# Patient Record
Sex: Female | Born: 1965 | Race: White | Hispanic: No | Marital: Married | Smoking: Never smoker
Health system: Southern US, Community
[De-identification: ages and names within clinical notes are randomized; demographics above are authoritative.]

---

## 2018-06-03 ENCOUNTER — Emergency Department: Payer: PRIVATE HEALTH INSURANCE

## 2018-06-03 ENCOUNTER — Other Ambulatory Visit: Payer: Self-pay

## 2018-06-03 ENCOUNTER — Encounter: Payer: Self-pay | Admitting: Emergency Medicine

## 2018-06-03 ENCOUNTER — Emergency Department
Admission: EM | Admit: 2018-06-03 | Discharge: 2018-06-03 | Disposition: A | Discharge status: Home / Self Care | Payer: PRIVATE HEALTH INSURANCE | Attending: Emergency Medicine | Admitting: Emergency Medicine

## 2018-06-03 DIAGNOSIS — F10929 Alcohol use, unspecified with intoxication, unspecified: Secondary | ICD-10-CM | POA: Insufficient documentation

## 2018-06-03 DIAGNOSIS — S52502A Unspecified fracture of the lower end of left radius, initial encounter for closed fracture: Secondary | ICD-10-CM | POA: Diagnosis not present

## 2018-06-03 DIAGNOSIS — R51 Headache: Secondary | ICD-10-CM | POA: Insufficient documentation

## 2018-06-03 DIAGNOSIS — S0181XA Laceration without foreign body of other part of head, initial encounter: Secondary | ICD-10-CM | POA: Diagnosis present

## 2018-06-03 DIAGNOSIS — W108XXA Fall (on) (from) other stairs and steps, initial encounter: Secondary | ICD-10-CM | POA: Insufficient documentation

## 2018-06-03 DIAGNOSIS — Y939 Activity, unspecified: Secondary | ICD-10-CM | POA: Insufficient documentation

## 2018-06-03 DIAGNOSIS — Y929 Unspecified place or not applicable: Secondary | ICD-10-CM | POA: Insufficient documentation

## 2018-06-03 DIAGNOSIS — Y999 Unspecified external cause status: Secondary | ICD-10-CM | POA: Diagnosis not present

## 2018-06-03 DIAGNOSIS — W19XXXA Unspecified fall, initial encounter: Secondary | ICD-10-CM

## 2018-06-03 LAB — CBC
HEMATOCRIT: 36.2 % (ref 35.0–47.0)
HEMOGLOBIN: 12.8 g/dL (ref 12.0–16.0)
MCH: 31.6 pg (ref 26.0–34.0)
MCHC: 35.4 g/dL (ref 32.0–36.0)
MCV: 89.1 fL (ref 80.0–100.0)
Platelets: 234 10*3/uL (ref 150–440)
RBC: 4.07 MIL/uL (ref 3.80–5.20)
RDW: 14.2 % (ref 11.5–14.5)
WBC: 8.8 10*3/uL (ref 3.6–11.0)

## 2018-06-03 LAB — BASIC METABOLIC PANEL
ANION GAP: 8 (ref 5–15)
BUN: 15 mg/dL (ref 6–20)
CHLORIDE: 109 mmol/L (ref 98–111)
CO2: 23 mmol/L (ref 22–32)
CREATININE: 0.63 mg/dL (ref 0.44–1.00)
Calcium: 8.3 mg/dL — ABNORMAL LOW (ref 8.9–10.3)
GFR calc non Af Amer: >60 mL/min (ref >60)
Glucose, Bld: 107 mg/dL — ABNORMAL HIGH (ref 70–99)
POTASSIUM: 3.5 mmol/L (ref 3.5–5.1)
Sodium: 140 mmol/L (ref 135–145)

## 2018-06-03 LAB — TROPONIN I

## 2018-06-03 LAB — ETHANOL: Alcohol, Ethyl (B): 272 mg/dL — ABNORMAL HIGH (ref <10)

## 2018-06-03 MED ORDER — IBUPROFEN 600 MG PO TABS
600.0000 mg | ORAL_TABLET | Freq: Once | ORAL | Status: AC
  Administered 2018-06-03: 600 mg via ORAL
  Filled 2018-06-03: qty 1

## 2018-06-03 MED ORDER — HYDROCODONE-ACETAMINOPHEN 5-325 MG PO TABS
1.0000 | ORAL_TABLET | Freq: Once | ORAL | Status: AC
  Administered 2018-06-03: 1 via ORAL
  Filled 2018-06-03: qty 1

## 2018-06-03 MED ORDER — DOCUSATE SODIUM 100 MG PO CAPS: ORAL_CAPSULE | ORAL | 0 refills | Status: AC

## 2018-06-03 MED ORDER — ACETAMINOPHEN 325 MG PO TABS
650.0000 mg | ORAL_TABLET | Freq: Once | ORAL | Status: AC
  Administered 2018-06-03: 650 mg via ORAL
  Filled 2018-06-03: qty 2

## 2018-06-03 MED ORDER — HYDROCODONE-ACETAMINOPHEN 5-325 MG PO TABS: 1.0000 | ORAL_TABLET | ORAL | 0 refills | Status: AC | PRN

## 2018-06-03 NOTE — Discharge Instructions (Signed)
You have been seen in the Emergency Department (ED) today for a fall.  Your work up does not show any injuries that will keep you in the hospital.  You do have a severe fracture of your left wrist and will likely need surgery.  Please follow up with an orthopedic surgeon near your home at the next available opportunity.  Please avoid drinking any additional alcohol in preparation for probable surgery.  Please take over-the-counter ibuprofen and/or Tylenol as needed for your pain (unless you have an allergy or your doctor as told you not to take them), or take any prescribed medication as instructed.  Take Norco as prescribed for severe pain. Do not drink alcohol, drive or participate in any other potentially dangerous activities while taking this medication as it may make you sleepy. Do not take this medication with any other sedating medications, either prescription or over-the-counter. If you were prescribed Percocet or Vicodin, do not take these with acetaminophen (Tylenol) as it is already contained within these medications.   This medication is an opiate (or narcotic) pain medication and can be habit forming.  Use it as little as possible to achieve adequate pain control.  Do not use or use it with extreme caution if you have a history of opiate abuse or dependence.  If you are on a pain contract with your primary care doctor or a pain specialist, be sure to let them know you were prescribed this medication today from the [facility].  This medication is intended for your use only - do not give any to anyone else and keep it in a secure place where nobody else, especially children, have access to it.  It will also cause or worsen constipation, so you may want to consider taking an over-the-counter stool softener while you are taking this medication.  Please follow up with your doctor regarding today's Emergency Department (ED) visit and your recent fall.    Return to the ED if  you have any headache, confusion, slurred speech, weakness/numbness of any arm or leg, or any increased pain.

## 2018-06-03 NOTE — ED Provider Notes (Signed)
[facility] Emergency Department Provider Note  ____________________________________________   First MD Initiated Contact with Patient 06/03/18 0121     (approximate)  I have reviewed the triage vital signs and the nursing notes.   HISTORY  Chief Complaint Fall    HPI Mary Willis is a 52 y.o. female who denies any chronic medical issues but who does admit to heavy drinking on the weekend.  She presents by EMS for evaluation after a fall.  Reportedly the patient fell down about 9 stairs after drinking a large amount of alcohol.  She is currently awake and alert.  She denies trying to hurt herself and reports that she only drinks on the weekend but admits that she may have a drinking problem.  She does not believe that she lost consciousness.  She also has pain and deformity of the left wrist which she is not able to move without severe pain.  She has no pain in her neck and only a mild headache.  She denies any chest pain, shortness of breath, nausea, vomiting, abdominal pain, and pain in her legs.  Everything occurred very acutely and rapidly and the pain in the left wrist is severe with movement, mild at rest, rest of the pain is mild.  History reviewed. No pertinent past medical history.  There are no active problems to display for this patient.   History reviewed. No pertinent surgical history.  Prior to Admission medications   Medication Sig Start Date End Date Taking? Authorizing Provider  docusate sodium (COLACE) 100 MG capsule Take 1 tablet once or twice daily as needed for constipation while taking narcotic pain medicine 06/03/18   Loleta Rose, MD  HYDROcodone-acetaminophen (NORCO/VICODIN) 5-325 MG tablet Take 1-2 tablets by mouth every 4 (four) hours as needed for moderate pain. 06/03/18   Loleta Rose, MD    Allergies Patient has no known allergies.  History reviewed. No pertinent family history.  Social History Social History   Tobacco  Use  . Smoking status: Never Smoker  . Smokeless tobacco: Never Used  Substance Use Topics  . Alcohol use: Yes  . Drug use: Never    Review of Systems Constitutional: No fever/chills Eyes: No visual changes. ENT: No sore throat. Cardiovascular: Denies chest pain. Respiratory: Denies shortness of breath. Gastrointestinal: No abdominal pain.  No nausea, no vomiting.  No diarrhea.  No constipation. Genitourinary: Negative for dysuria. Musculoskeletal: Pain and deformity in left wrist after fall.  Negative for neck pain.  Negative for back pain. Integumentary: Negative for rash. Neurological: Negative for headaches, focal weakness or numbness. Psychiatric:Intoxication with alcohol, no SI or HI  ____________________________________________   PHYSICAL EXAM:  VITAL SIGNS: ED Triage Vitals  Enc Vitals Group     BP 06/03/18 0100 107/70     Pulse Rate 06/03/18 0100 82     Resp 06/03/18 0100 (!) 27     Temp 06/03/18 0103 98 F (36.7 C)     Temp Source 06/03/18 0103 Oral     SpO2 06/03/18 0100 95 %     Weight 06/03/18 0104 81.6 kg (180 lb)     Height 06/03/18 0104 1.803 m (5\' 11" )     Head Circumference --      Peak Flow --      Pain Score 06/03/18 0104 0     Pain Loc --      Pain Edu? --      Excl. in GC? --     Constitutional: Alert and  oriented.  Generally well-appearing in spite of injuries, no acute distress, acting appropriate. Eyes: Conjunctivae are normal. PERRL. EOMI. Head: Atraumatic except for laceration along the left jawline as described below Nose: No congestion/rhinnorhea. Mouth/Throat: Mucous membranes are moist. Neck: No stridor.  No meningeal signs.  No cervical spine tenderness to palpation. Cardiovascular: Normal rate, regular rhythm. Good peripheral circulation. Grossly normal heart sounds. Respiratory: Normal respiratory effort.  No retractions. Lungs CTAB. Gastrointestinal: Soft and nontender. No distention.  Musculoskeletal: Gross deformity of left  wrist consistent with distal radial fracture.  Neurovascularly intact with normal sensation of the hand. Able to move fingers. Neurologic:  Normal speech and language. No gross focal neurologic deficits are appreciated.  Skin:  Skin is warm, dry and intact. No rash noted. Small sub-cm laceration along left mandible, bleeding well controlled. Psychiatric: Mood and affect are normal. Speech and behavior are normal.  Good insight and judgment. ____________________________________________   LABS (all labs ordered are listed, but only abnormal results are displayed)  Labs Reviewed  BASIC METABOLIC PANEL - Abnormal; Notable for the following components:      Result Value   Glucose, Bld 107 (*)    Calcium 8.3 (*)    All other components within normal limits  ETHANOL - Abnormal; Notable for the following components:   Alcohol, Ethyl (B) 272 (*)    All other components within normal limits  TROPONIN I  CBC   ____________________________________________  EKG  ED ECG REPORT I, Loleta Rose, the attending physician, personally viewed and interpreted this ECG.  Date: 06/03/2018 EKG Time: 01:04 Rate: 80 Rhythm: normal sinus rhythm QRS Axis: normal Intervals: normal ST/T Wave abnormalities: normal Narrative Interpretation: no evidence of acute ischemia  ____________________________________________  RADIOLOGY I, Loleta Rose, personally viewed and evaluated these images (plain radiographs) as part of my medical decision making, as well as reviewing the written report by the radiologist.  ED MD interpretation: Comminuted and impacted left distal radial fracture.  No acute findings on CT scans.  Official radiology report(s): Dg Wrist Complete Left  Result Date: 06/03/2018 CLINICAL DATA:  Left wrist pain and deformity after fall down stairs. EXAM: LEFT WRIST - COMPLETE 3+ VIEW COMPARISON:  None. FINDINGS: Comminuted impacted fracture of the distal radial metaphysis. Mild apex volar  angulation. Fracture involves the distal radioulnar and possible radiocarpal joint. Associated displacement styloid fracture. Carpal bones appear intact. Soft tissue edema at the fracture sites. IMPRESSION: Comminuted impacted distal radius fracture extending into the distal radioulnar and possible radiocarpal joints. Displaced ulna styloid fracture. Electronically Signed   By: Narda Rutherford M.D.   On: 06/03/2018 01:45   Ct Head Wo Contrast  Result Date: 06/03/2018 CLINICAL DATA:  Unwitnessed fall down 9 stairs. Alcohol intoxication. EXAM: CT HEAD WITHOUT CONTRAST CT CERVICAL SPINE WITHOUT CONTRAST TECHNIQUE: Multidetector CT imaging of the head and cervical spine was performed following the standard protocol without intravenous contrast. Multiplanar CT image reconstructions of the cervical spine were also generated. COMPARISON:  None. FINDINGS: CT HEAD FINDINGS BRAIN: No intraparenchymal hemorrhage, mass effect nor midline shift. The ventricles and sulci are normal. No acute large vascular territory infarcts. No abnormal extra-axial fluid collections. Basal cisterns are patent. Cerebellar tonsils at but not below the foramen magnum. 7 mm calcification arising from inner table calvarium, potential meningioma without mass effect. VASCULAR: Unremarkable. SKULL/SOFT TISSUES: No skull fracture. No significant soft tissue swelling. ORBITS/SINUSES: The included ocular globes and orbital contents are normal.Trace paranasal sinus mucosal thickening. Mastoid air cells are well aerated. OTHER: None.  CT CERVICAL SPINE FINDINGS ALIGNMENT: Straightened lordosis.  Vertebral bodies in alignment. SKULL BASE AND VERTEBRAE: Cervical vertebral bodies and posterior elements are intact. Developmentally unfused C3 spinous process. Intervertebral disc heights preserved. No destructive bony lesions. 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 SOFT TISSUES AND SPINAL CANAL: Nonacute. DISC LEVELS: No significant osseous  canal stenosis. Mild C3-4 neural foraminal narrowing. UPPER CHEST: Lung apices are clear. OTHER: None. IMPRESSION: CT HEAD: 1. No acute intracranial process. 2. Subcentimeter RIGHT frontal suspected meningioma without mass effect; otherwise negative noncontrast CT HEAD. CT CERVICAL SPINE: 1. No fracture or malalignment. Electronically Signed   By: Awilda Metroourtnay  Bloomer M.D.   On: 06/03/2018 01:57   Ct Cervical Spine Wo Contrast  Result Date: 06/03/2018 CLINICAL DATA:  Unwitnessed fall down 9 stairs. Alcohol intoxication. EXAM: CT HEAD WITHOUT CONTRAST CT CERVICAL SPINE WITHOUT CONTRAST TECHNIQUE: Multidetector CT imaging of the head and cervical spine was performed following the standard protocol without intravenous contrast. Multiplanar CT image reconstructions of the cervical spine were also generated. COMPARISON:  None. FINDINGS: CT HEAD FINDINGS BRAIN: No intraparenchymal hemorrhage, mass effect nor midline shift. The ventricles and sulci are normal. No acute large vascular territory infarcts. No abnormal extra-axial fluid collections. Basal cisterns are patent. Cerebellar tonsils at but not below the foramen magnum. 7 mm calcification arising from inner table calvarium, potential meningioma without mass effect. VASCULAR: Unremarkable. SKULL/SOFT TISSUES: No skull fracture. No significant soft tissue swelling. ORBITS/SINUSES: The included ocular globes and orbital contents are normal.Trace paranasal sinus mucosal thickening. Mastoid air cells are well aerated. OTHER: None. CT CERVICAL SPINE FINDINGS ALIGNMENT: Straightened lordosis.  Vertebral bodies in alignment. SKULL BASE AND VERTEBRAE: Cervical vertebral bodies and posterior elements are intact. Developmentally unfused C3 spinous process. Intervertebral disc heights preserved. No destructive bony lesions. 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 SOFT TISSUES AND SPINAL CANAL: Nonacute. DISC LEVELS: No significant osseous canal stenosis. Mild  C3-4 neural foraminal narrowing. UPPER CHEST: Lung apices are clear. OTHER: None. IMPRESSION: CT HEAD: 1. No acute intracranial process. 2. Subcentimeter RIGHT frontal suspected meningioma without mass effect; otherwise negative noncontrast CT HEAD. CT CERVICAL SPINE: 1. No fracture or malalignment. Electronically Signed   By: Awilda Metroourtnay  Bloomer M.D.   On: 06/03/2018 01:57    ____________________________________________   PROCEDURES  Critical Care performed: No   Procedure(s) performed:   .Ortho Injury Treatment Date/Time: 06/03/2018 6:02 AM Performed by: Loleta RoseForbach, Matyas Baisley, MD Authorized by: Loleta RoseForbach, Sawyer Mentzer, MD   Consent:    Consent obtained:  Verbal   Risks discussed:  Fracture   Alternatives discussed:  No treatmentInjury location: wrist Location details: left wrist Injury type: fracture Fracture type: distal radius Pre-procedure neurovascular assessment: neurovascularly intact Pre-procedure distal perfusion: normal Pre-procedure neurological function: normal Immobilization: splint Splint type: sugar tong Supplies used: Ortho-Glass Post-procedure neurovascular assessment: post-procedure neurovascularly intact Post-procedure distal perfusion: normal Post-procedure neurological function: normal Patient tolerance: Patient tolerated the procedure well with no immediate complications      ____________________________________________   INITIAL IMPRESSION / ASSESSMENT AND PLAN / ED COURSE  As part of my medical decision making, I reviewed the following data within the electronic MEDICAL RECORD NUMBER Nursing notes reviewed and incorporated, Labs reviewed , EKG interpreted , Old chart reviewed, Radiograph reviewed , discussed with Ortho (Dr. Rosita KeaMenz) and Notes from prior ED visits    Differential diagnosis includes, but is not limited to, intoxication and mechanical fall, syncope, vasovagal episode, acute intracranial hemorrhage, cervical spine injury, radial fracture.  Fortunately the  patient seems to have avoided severe injuries.  She does have a complicated left wrist fracture for which I spoke with Dr. Rosita Kea and he advised that surgery would be necessary.  However the patient is from out of town and I explained everything in the importance of close follow-up with an orthopedic surgeon near her home.  I provided a CD with her CT scans and radiographs.  Her lab results are reassuring with no abnormalities other than her ethanol level of 272 but she is awake, alert, and oriented.  Family is at bedside.  I stressed her the importance of  Decreasing her alcohol intake and seeking help with her dependence with her primary care doctor.  They all understand and agree with the plan.  I gave my usual and customary return precautions.    Clinical Course as of Jun 04 603  Wynelle Link Jun 03, 2018  [id number] I reviewed the patient's prescription history over the last 24 months in the multi-state controlled substances database(s) that includes [place], [place], [place], [place], [place], [place], [place], [place], [place], [place], [place], [place], [place], [place], [place], [place], [place], [place], [place], [place], [place], [place], [place], [place], [place], [place], [place], [place], [place], [place], [place], [place] PMP, and [place].  Results were notable for regular prescriptions for alprazolam but no chronic pain medications.  I think under the circumstances it will be appropriate for her to have a short course of Norco given the severity of her left wrist fracture.   [CF]    Clinical Course User Index [CF] Loleta Rose, MD    ____________________________________________  FINAL CLINICAL IMPRESSION(S) / ED DIAGNOSES  Final diagnoses:  Fall, initial encounter  Alcoholic intoxication with complication (HCC)  Facial laceration, initial encounter  Closed fracture of distal end of left radius,  unspecified fracture morphology, initial encounter     MEDICATIONS GIVEN DURING THIS VISIT:  Medications  HYDROcodone-acetaminophen (NORCO/VICODIN) 5-325 MG per tablet 1 tablet (1 tablet Oral Given 06/03/18 0339)  acetaminophen (TYLENOL) tablet 650 mg (650 mg Oral Given 06/03/18 0338)  ibuprofen (ADVIL,MOTRIN) tablet 600 mg (600 mg Oral Given 06/03/18 0338)     ED Discharge Orders         Ordered    HYDROcodone-acetaminophen (NORCO/VICODIN) 5-325 MG tablet  Every 4 hours PRN     06/03/18 0434    docusate sodium (COLACE) 100 MG capsule     06/03/18 0434           Note:  This document was prepared using Dragon voice recognition software and may include unintentional dictation errors.    Loleta Rose, MD 06/03/18 [phone]

## 2018-06-03 NOTE — ED Triage Notes (Signed)
Pt arrives via [facility] with c/o unwitnessed fall. Per EMS, pt fell down nine stairs and has unknown quantity of ETOH on board. Pt's story has changed a few times during triage at this time. Pt denies LOC and is calm and cooperative at this time.

## 2019-04-28 IMAGING — DX DG WRIST COMPLETE 3+V*L*
4 series · 4 of 4 positions shown · non-contrast
Comparison: None.

CLINICAL DATA: Left wrist pain and deformity after fall down
stairs.

EXAM:
LEFT WRIST - COMPLETE 3+ VIEW

[wrist ap (1 of 2)]
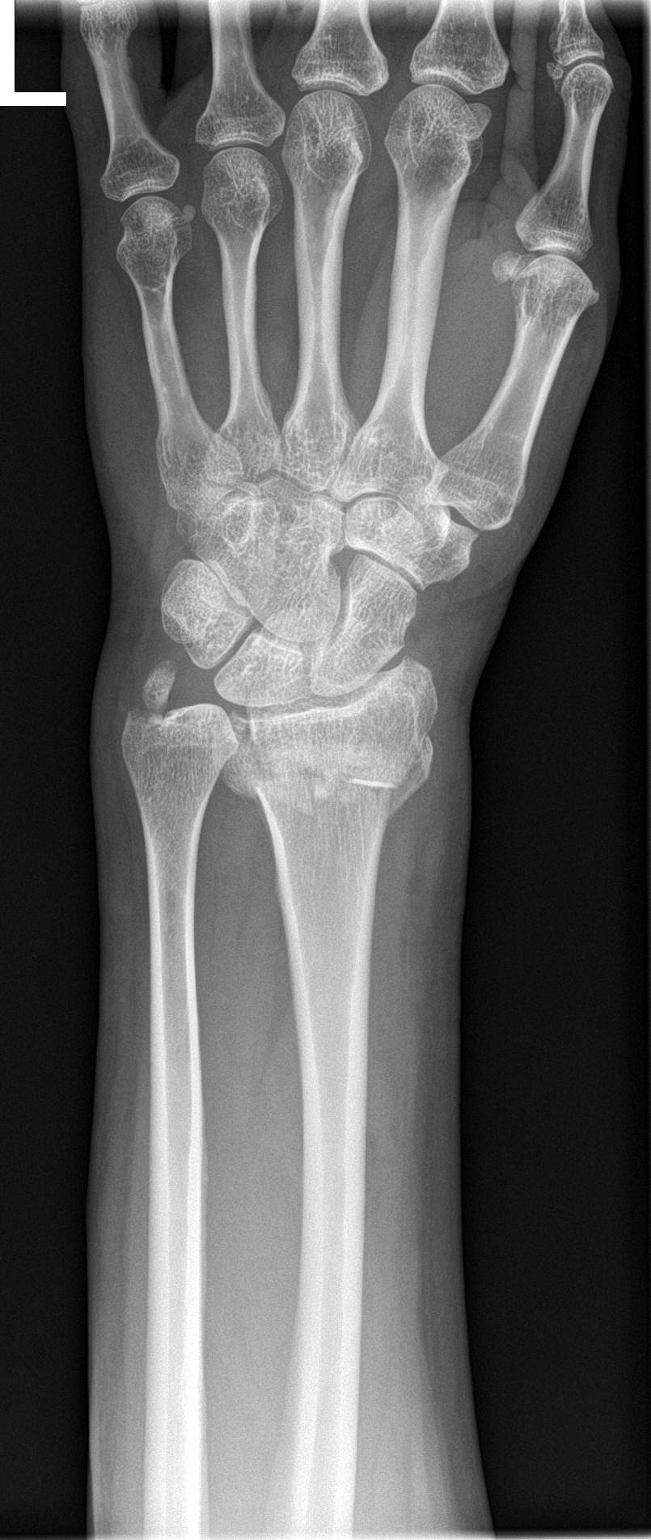

[wrist obl]
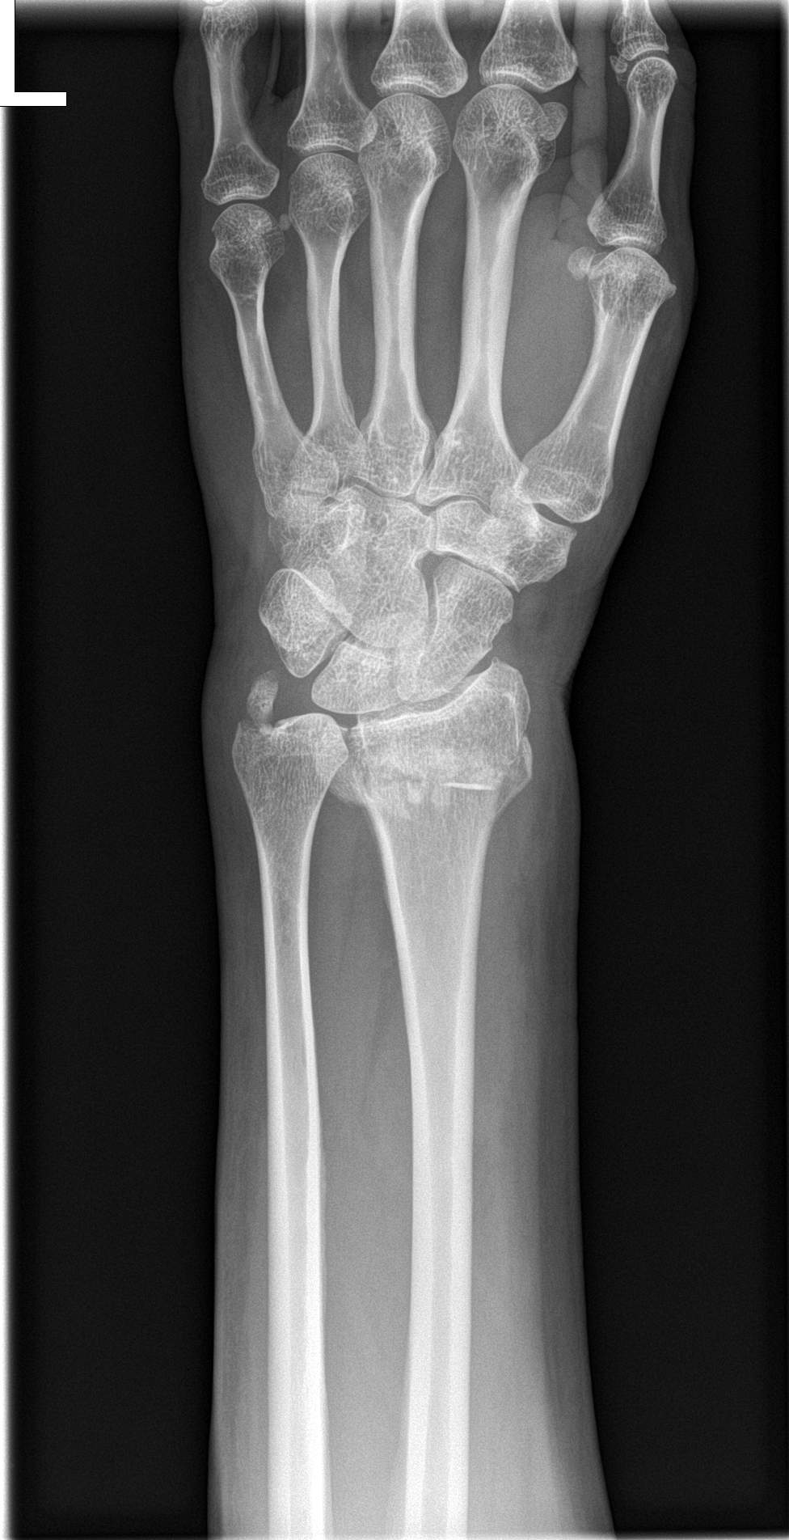

[wrist lat]
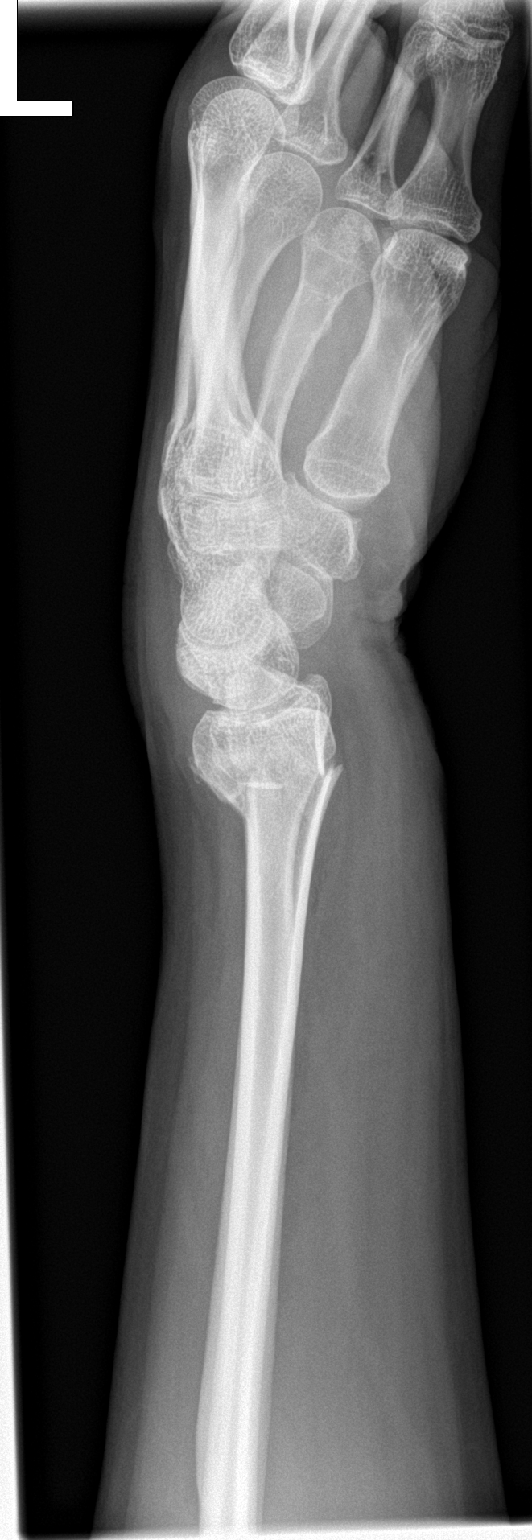

[wrist ap (2 of 2)]
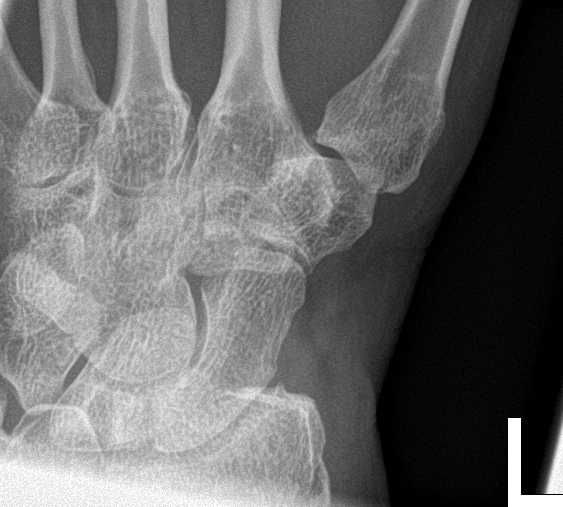

[4 of 4 positions shown; findings below may reference images not displayed]

FINDINGS: Comminuted impacted fracture of the distal radial metaphysis. Mild
apex volar angulation. Fracture involves the distal radioulnar and
possible radiocarpal joint. Associated displacement styloid
fracture. Carpal bones appear intact. Soft tissue edema at the
fracture sites.
IMPRESSION: Comminuted impacted distal radius fracture extending into the distal
radioulnar and possible radiocarpal joints.

Displaced ulna styloid fracture.
